# Patient Record
Sex: Male | Born: 1975 | Race: White | Hispanic: No | Marital: Married | Smoking: Former smoker
Health system: Southern US, Community
[De-identification: ages and names within clinical notes are randomized; demographics above are authoritative.]

---

## 2003-02-06 ENCOUNTER — Emergency Department (HOSPITAL_COMMUNITY): Admission: EM | Admit: 2003-02-06 | Discharge: 2003-02-06 | Payer: Self-pay | Admitting: Emergency Medicine

## 2005-04-26 DIAGNOSIS — G610 Guillain-Barre syndrome: Secondary | ICD-10-CM

## 2005-04-26 HISTORY — DX: Guillain-Barre syndrome: G61.0

## 2005-05-25 ENCOUNTER — Emergency Department (HOSPITAL_COMMUNITY): Admission: EM | Admit: 2005-05-25 | Discharge: 2005-05-25 | Payer: Self-pay | Admitting: Family Medicine

## 2005-05-29 ENCOUNTER — Emergency Department (HOSPITAL_COMMUNITY): Admission: EM | Admit: 2005-05-29 | Discharge: 2005-05-29 | Payer: Self-pay | Admitting: Emergency Medicine

## 2005-05-31 ENCOUNTER — Ambulatory Visit: Payer: Self-pay | Admitting: Physical Medicine & Rehabilitation

## 2005-05-31 ENCOUNTER — Inpatient Hospital Stay (HOSPITAL_COMMUNITY): Admission: EM | Admit: 2005-05-31 | Discharge: 2005-06-07 | Payer: Self-pay | Admitting: Emergency Medicine

## 2005-07-09 ENCOUNTER — Encounter: Admission: RE | Admit: 2005-07-09 | Discharge: 2005-07-09 | Payer: Self-pay | Admitting: Family Medicine

## 2007-06-25 IMAGING — CR DG CHEST 2V
2 series · 2 of 2 positions shown · non-contrast
Comparison: none

CLINICAL DATA: [place]?.  Evaluate for pneumonia. 
 CHEST ? 2 VIEW:

[w chest pa]
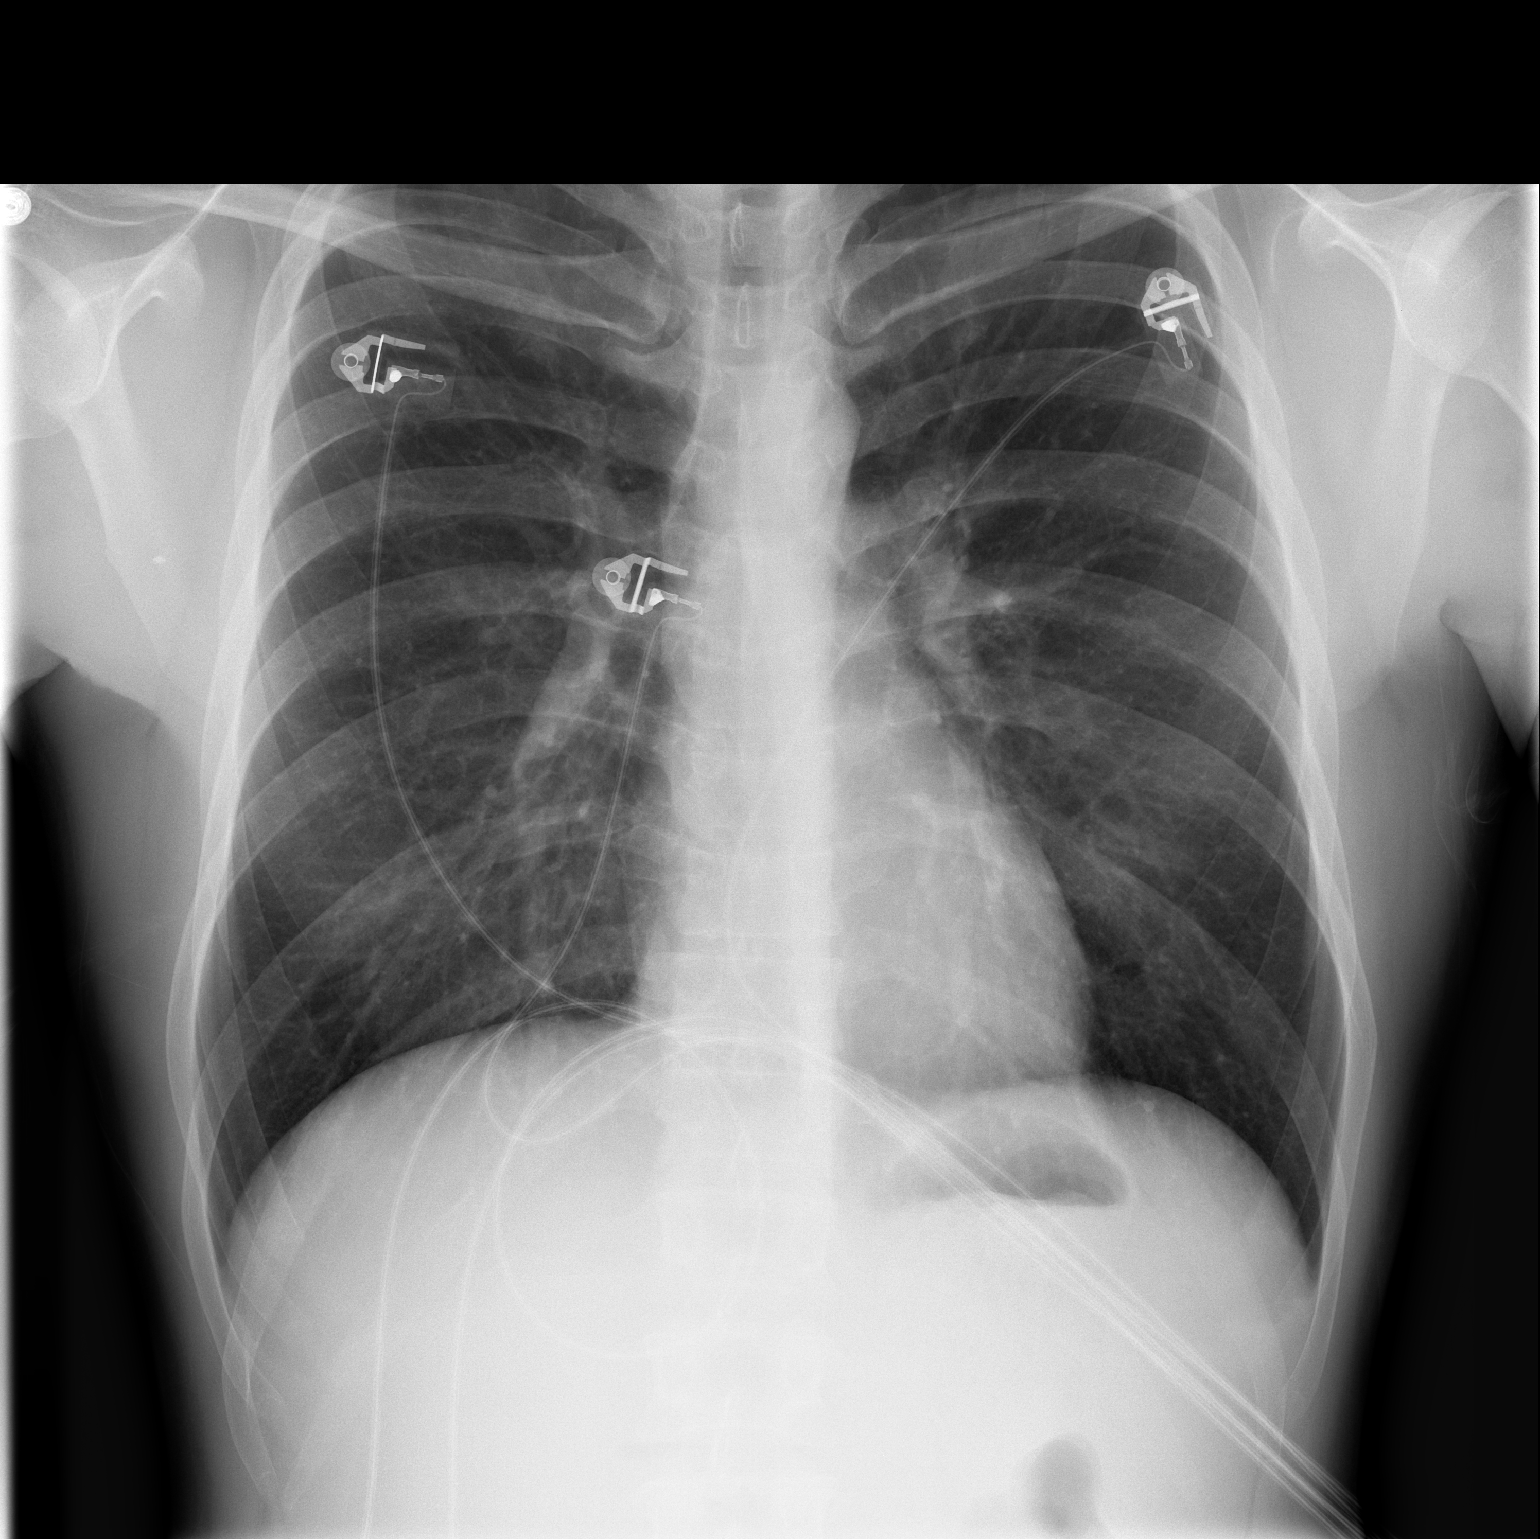

[w chest lat]
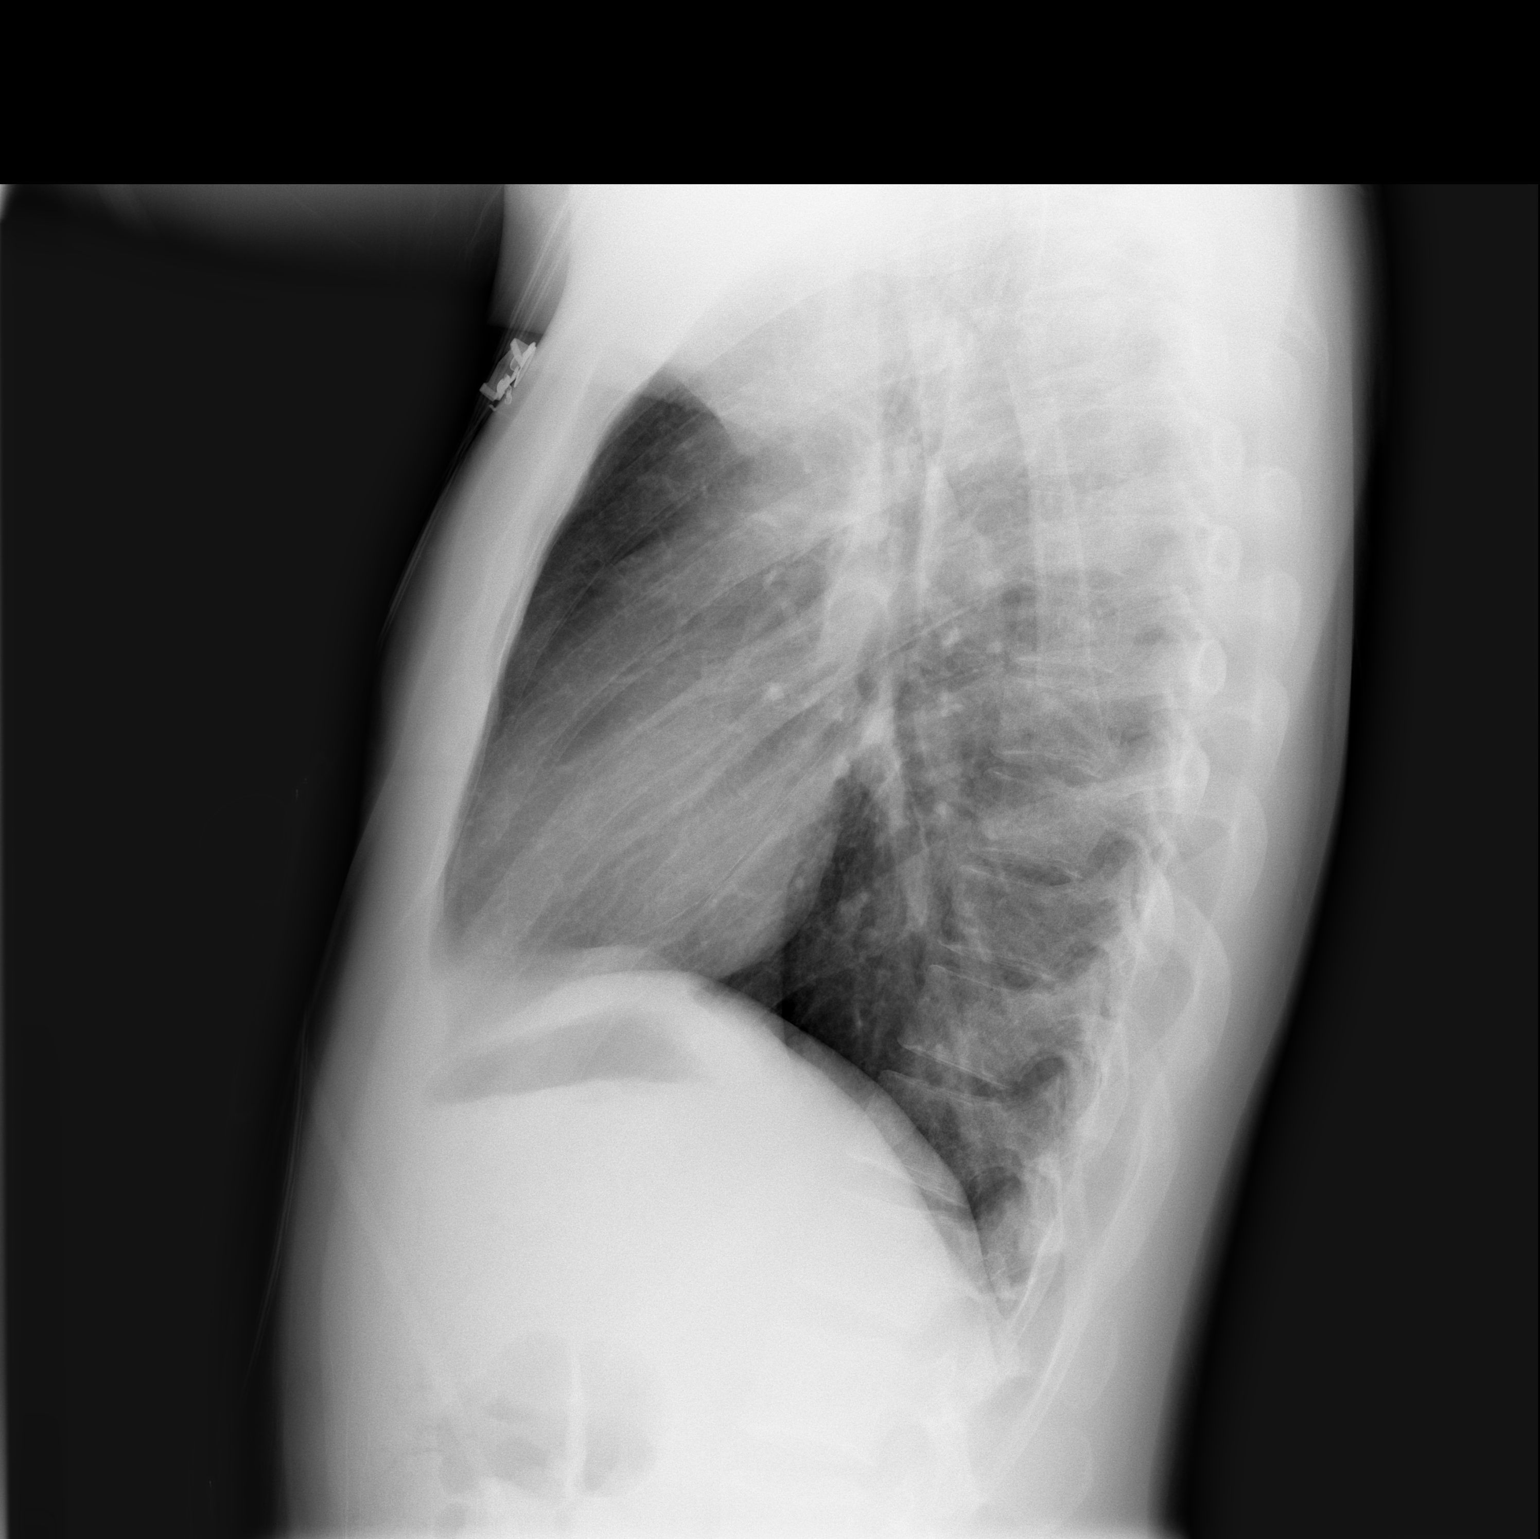

[2 of 2 positions shown; findings below may reference images not displayed]

FINDINGS: Two views of the chest show the lungs to be clear.  The heart is within normal limits in size.  No bony abnormality is seen.
IMPRESSION: No active lung disease.

## 2018-10-18 ENCOUNTER — Encounter (HOSPITAL_BASED_OUTPATIENT_CLINIC_OR_DEPARTMENT_OTHER): Payer: Self-pay | Admitting: Emergency Medicine

## 2018-10-18 ENCOUNTER — Other Ambulatory Visit: Payer: Self-pay

## 2018-10-18 ENCOUNTER — Emergency Department (HOSPITAL_BASED_OUTPATIENT_CLINIC_OR_DEPARTMENT_OTHER)
Admission: EM | Admit: 2018-10-18 | Discharge: 2018-10-19 | Disposition: A | Discharge status: Home / Self Care | Attending: Emergency Medicine | Admitting: Emergency Medicine

## 2018-10-18 ENCOUNTER — Emergency Department (HOSPITAL_BASED_OUTPATIENT_CLINIC_OR_DEPARTMENT_OTHER)

## 2018-10-18 DIAGNOSIS — Y990 Civilian activity done for income or pay: Secondary | ICD-10-CM | POA: Diagnosis not present

## 2018-10-18 DIAGNOSIS — Z23 Encounter for immunization: Secondary | ICD-10-CM | POA: Diagnosis not present

## 2018-10-18 DIAGNOSIS — S61111A Laceration without foreign body of right thumb with damage to nail, initial encounter: Secondary | ICD-10-CM | POA: Insufficient documentation

## 2018-10-18 DIAGNOSIS — Y9389 Activity, other specified: Secondary | ICD-10-CM | POA: Insufficient documentation

## 2018-10-18 DIAGNOSIS — W230XXA Caught, crushed, jammed, or pinched between moving objects, initial encounter: Secondary | ICD-10-CM | POA: Diagnosis not present

## 2018-10-18 DIAGNOSIS — Y9289 Other specified places as the place of occurrence of the external cause: Secondary | ICD-10-CM | POA: Diagnosis not present

## 2018-10-18 DIAGNOSIS — Z87891 Personal history of nicotine dependence: Secondary | ICD-10-CM | POA: Diagnosis not present

## 2018-10-18 DIAGNOSIS — S60111A Contusion of right thumb with damage to nail, initial encounter: Secondary | ICD-10-CM

## 2018-10-18 DIAGNOSIS — S6991XA Unspecified injury of right wrist, hand and finger(s), initial encounter: Secondary | ICD-10-CM | POA: Diagnosis present

## 2018-10-18 DIAGNOSIS — S6701XA Crushing injury of right thumb, initial encounter: Secondary | ICD-10-CM

## 2018-10-18 MED ORDER — FENTANYL CITRATE (PF) 100 MCG/2ML IJ SOLN
100.0000 ug | Freq: Once | INTRAMUSCULAR | Status: AC
  Administered 2018-10-19: 100 ug via INTRAVENOUS
  Filled 2018-10-18: qty 2

## 2018-10-18 MED ORDER — CEFAZOLIN SODIUM-DEXTROSE 1-4 GM/50ML-% IV SOLN
1.0000 g | Freq: Once | INTRAVENOUS | Status: AC
Start: 2018-10-19 — End: 2018-10-19
  Administered 2018-10-19: 1 g via INTRAVENOUS
  Filled 2018-10-18: qty 50

## 2018-10-18 MED ORDER — TETANUS-DIPHTH-ACELL PERTUSSIS 5-2.5-18.5 LF-MCG/0.5 IM SUSP
0.5000 mL | Freq: Once | INTRAMUSCULAR | Status: AC
  Administered 2018-10-19: 0.5 mL via INTRAMUSCULAR
  Filled 2018-10-18: qty 0.5

## 2018-10-18 NOTE — ED Provider Notes (Signed)
[facility] Provider Note: Georgena Spurling, MD, FACEP  CSN: [id number] MRN: [id number] ARRIVAL: 10/18/18 at 80 ROOM: Reader  Thumb Injury   HISTORY OF PRESENT ILLNESS  10/18/18 11:41 PM Andrew Anderson is a 43 y.o. male who suffered a crush injury of his right thumb at work just prior to arrival.  He has a laceration across the pad of his right thumb.  He has associated pain which he rates as an 8 out of 10, worse with movement or palpation.  The pain is primarily in the distal phalanx but radiates proximally.  There is no functional deficit.  Sensation is present but decreased distally.  Tetanus is not up-to-date.   Past Medical History:  Diagnosis Date  . Guillain Barr syndrome ([place]) 04/26/2005    History reviewed. No pertinent surgical history.  No family history on file.  Social History   Tobacco Use  . Smoking status: Former Smoker  Substance Use Topics  . Alcohol use: Not on file    Comment: occ  . Drug use: Never    Prior to Admission medications   Not on File    Allergies Patient has no known allergies.   REVIEW OF SYSTEMS  Negative except as noted here or in the History of Present Illness.   PHYSICAL EXAMINATION  Initial Vital Signs Blood pressure (!) 150/105, pulse 90, temperature 99.2 F (37.3 C), resp. rate 18, height 5\' 11"  (1.803 m), weight 79.4 kg, SpO2 100 %.  Examination General: Well-developed, well-nourished male in no acute distress; appearance consistent with age of record HENT: normocephalic; atraumatic Eyes: Normal appearance Neck: supple Heart: regular rate and rhythm Lungs: clear to auscultation bilaterally Abdomen: soft; nondistended; nontender; bowel sounds present Extremities: No deformity; full range of motion; pulses normal; laceration and tenderness of distal phalanx of right thumb, subungual hematoma of right thumb, sensation partially decreased distally, brisk capillary refill  distally:    Neurologic: Awake, alert and oriented; motor function intact in all extremities and symmetric; no facial droop Skin: Warm and dry Psychiatric: Normal mood and affect   RESULTS  Summary of this visit's results, reviewed by myself:   EKG Interpretation  Date/Time:    Ventricular Rate:    PR Interval:    QRS Duration:   QT Interval:    QTC Calculation:   R Axis:     Text Interpretation:        Laboratory Studies: No results found for this or any previous visit (from the past 24 hour(s)). Imaging Studies: Dg Finger Thumb Right  Result Date: 10/19/2018 CLINICAL DATA:  43 year old male with trauma to the thumb. EXAM: RIGHT THUMB 2+V COMPARISON:  None. FINDINGS: There is no acute fracture or dislocation. The bones are well mineralized. No arthritic changes. There is laceration of the soft tissues of the distal thumb. No radiopaque foreign object or soft tissue gas. IMPRESSION: No acute/traumatic osseous pathology. Electronically Signed   By: Anner Crete M.D.   On: 10/19/2018 00:30    ED COURSE and MDM  Nursing notes and initial vitals signs, including pulse oximetry, reviewed.  Vitals:   10/18/18 2335 10/18/18 2336  BP: (!) 150/105   Pulse: 90   Resp: 18   Temp: 99.2 F (37.3 C)   SpO2: 100%   Weight:  79.4 kg  Height:  5\' 11"  (1.803 m)   On exploration there was no deep laceration to repair.  Patient advised of signs and symptoms of compartment syndrome and  need to return should these occur.  He was advised he may use the thumb as tolerated providing he keeps it dry and bandaged.  PROCEDURES   NAIL TREPHINATION After informed verbal consent was obtained the patient's right thumb nail was trephinated with a pen cautery.  There was immediate release of blood from under the thumbnail.  The patient tolerated this well and there were no immediate complications.  LACERATION REPAIR Performed by: Carlisle BeersJohn L Dalin Caldera Authorized by: Carlisle BeersJohn L Idabell Picking Consent: Verbal  consent obtained. Risks and benefits: risks, benefits and alternatives were discussed Consent given by: patient Patient identity confirmed: provided demographic data Prepped and Draped in normal sterile fashion Wound explored, no deep laceration, wound consists of denuded epidermis only  Laceration Location: Right thumb pad  Laceration Length: 2.5 cm  No Foreign Bodies seen or palpated  Anesthesia: Digital block  Local anesthetic: Bupivacaine 0.5 % without epinephrine  Anesthetic total: 5 ml  Irrigation method: syringe Amount of cleaning: Extensive  Skin closure: Not indicated  Patient tolerance: Patient tolerated the procedure well with no immediate complications.   ED DIAGNOSES     ICD-10-CM   1. Crushing injury of right thumb, initial encounter  S67.01XA   2. Subungual hematoma of right thumb, initial encounter  Z61.096ES60.111A        Paula LibraMolpus, Adelaida Reindel, MD 10/19/18 [phone]
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
## 2018-10-19 MED ORDER — CEPHALEXIN 500 MG PO CAPS: 500.0000 mg | ORAL_CAPSULE | Freq: Four times a day (QID) | ORAL | 0 refills | Status: DC

## 2018-10-19 NOTE — ED Notes (Signed)
Provider at bedside for laceration/crush repair.

## 2018-10-19 NOTE — ED Notes (Signed)
Curad applied. Sterile guaze applied. Wound wrapped and secured. Patient informed on wound care.

## 2018-10-19 NOTE — ED Notes (Signed)
Provider at bedside

## 2019-03-15 ENCOUNTER — Telehealth: Payer: Self-pay | Admitting: Physician Assistant

## 2019-03-15 DIAGNOSIS — B86 Scabies: Secondary | ICD-10-CM

## 2019-03-15 MED ORDER — PERMETHRIN 5 % EX CREA: TOPICAL_CREAM | CUTANEOUS | 0 refills | Status: AC

## 2019-03-15 NOTE — Progress Notes (Signed)
E-Visit for Scabies  We are sorry that you are not feeling well. Here is how we plan to help!  Based on what you shared with me it looks like you have scabies.  Scabies is an infection caused by very tiny mites that burrow into the skin.  They cause severe itching. Though children are most commonly infected, anyone can get scabies.  Scabies mites can pass from person to person through physical close contact.  They can also be passed through shared clothing, towels, and bedding.  Scabies infection is not usually dangerous, but it is uncomfortable.  Because it is so contagious, scabies should be treated immediately to keep the infection from spreading.  If you have children in day care, please have any exposed children examined by their pediatrician. .   I have prescribed Permethrin topical cream 5% thoroughly massage cream from head to soles of feet; leave on for 8 to 14 hours before removing (shower or bath). May repeat if living mites are observed 14 days after first treatment; one application is generally curative.    HOME CARE:  . You should treat all members in your household whether they show symptoms or not. . Wash towels, clothes, bed linens, cloth toys, hats, and other personal items in hot water and dry on high heat. . Vacuum floors and furniture and throw away the bag afterwards. . Keep your child home from day care or school until the morning after treatment for scabies. . Notify your child's day care or school so that other children can be checked and treated.  GET HELP RIGHT AWAY IF:  . The infected person has a fever, red streaks, pain, or swelling of the skin. . Sores get worse or don't heal. . New rashes appear or itching continues for more than 2 weeks after treatment.  MAKE SURE YOU:   Understand these instructions.  Will watch your condition.  Will get help right away if you are not doing well or get worse.  Thank you for choosing an e-visit. Your e-visit answers  were reviewed by a board certified advanced clinical practitioner to complete your personal care plan.  Depending upon the condition, your plan could have included both over the counter or prescription medications.    Please review your pharmacy choice.  Make sure the pharmacy is open so you can pick up prescription now.   If there is a problem, you may contact your provider through MyChart messaging and have the prescription routed to another pharmacy. Your safety is important to us.  If you have drug allergies check your prescription carefully.    For the next 24 hours you can use MyChart to ask questions about today's visit, request a non-urgent call back, or ask for a work or school excuse.  You will get an email in the next 2 days asking about your experience.  I hope that your e-visit has been valuable and will speed your recovery.   Greater than 5 minutes, yet less than 10 minutes of time have been spent researching, coordinating, and implementing care for this patient today  

## 2019-08-03 ENCOUNTER — Telehealth: Payer: Self-pay | Admitting: Nurse Practitioner

## 2019-08-03 DIAGNOSIS — N61 Mastitis without abscess: Secondary | ICD-10-CM

## 2019-08-03 MED ORDER — CEPHALEXIN 500 MG PO CAPS: 500.0000 mg | ORAL_CAPSULE | Freq: Four times a day (QID) | ORAL | 0 refills | Status: AC

## 2019-08-03 NOTE — Progress Notes (Signed)

## 2019-09-25 ENCOUNTER — Other Ambulatory Visit: Payer: Self-pay

## 2019-09-25 ENCOUNTER — Emergency Department (HOSPITAL_BASED_OUTPATIENT_CLINIC_OR_DEPARTMENT_OTHER)
Admission: EM | Admit: 2019-09-25 | Discharge: 2019-09-25 | Disposition: A | Discharge status: Home / Self Care | Attending: Emergency Medicine | Admitting: Emergency Medicine

## 2019-09-25 ENCOUNTER — Encounter (HOSPITAL_BASED_OUTPATIENT_CLINIC_OR_DEPARTMENT_OTHER): Payer: Self-pay | Admitting: *Deleted

## 2019-09-25 DIAGNOSIS — Y990 Civilian activity done for income or pay: Secondary | ICD-10-CM | POA: Diagnosis not present

## 2019-09-25 DIAGNOSIS — T542X1A Toxic effect of corrosive acids and acid-like substances, accidental (unintentional), initial encounter: Secondary | ICD-10-CM | POA: Insufficient documentation

## 2019-09-25 DIAGNOSIS — X58XXXA Exposure to other specified factors, initial encounter: Secondary | ICD-10-CM | POA: Diagnosis not present

## 2019-09-25 DIAGNOSIS — T304 Corrosion of unspecified body region, unspecified degree: Secondary | ICD-10-CM

## 2019-09-25 DIAGNOSIS — Y9389 Activity, other specified: Secondary | ICD-10-CM | POA: Insufficient documentation

## 2019-09-25 DIAGNOSIS — Z87891 Personal history of nicotine dependence: Secondary | ICD-10-CM | POA: Diagnosis not present

## 2019-09-25 DIAGNOSIS — T2059XA Corrosion of first degree of multiple sites of head, face, and neck, initial encounter: Secondary | ICD-10-CM | POA: Insufficient documentation

## 2019-09-25 DIAGNOSIS — Y9289 Other specified places as the place of occurrence of the external cause: Secondary | ICD-10-CM | POA: Diagnosis not present
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
## 2019-11-06 ENCOUNTER — Telehealth: Payer: Managed Care, Other (non HMO) | Admitting: Nurse Practitioner

## 2019-11-06 DIAGNOSIS — K640 First degree hemorrhoids: Secondary | ICD-10-CM | POA: Diagnosis not present

## 2019-11-06 MED ORDER — HYDROCORTISONE ACETATE 25 MG RE SUPP: 25.0000 mg | Freq: Two times a day (BID) | RECTAL | 0 refills | Status: DC

## 2019-11-06 NOTE — Progress Notes (Signed)
E-Visit for Hemorrhoid  We are sorry that you are not feeling well. We are here to help!  Hemorrhoids are swollen veins in the rectum. They can cause itching, bleeding, and pain. Hemorrhoids are very common.  In some cases, you can see or feel hemorrhoids around the outside of the rectum. In other cases, you cannot see them because they are hidden inside the rectum. Be patient - It can take months for this to improve or go away.   Hemorrhoids do not always cause symptoms. But when they do, symptoms can include: ?Itching of the skin around the anus ?Bleeding - Bleeding is usually painless. You might see bright red blood after using the toilet. ?Pain - If a blood clot forms inside a hemorrhoid, this can cause pain. It can also cause a lump that you might be able to feel.   What can I do to keep from getting more hemorrhoids? -- The most important thing you can do is to keep from getting constipated. You should have a bowel movement at least a few times a week. When you have a bowel movement, you also should not have to push too much. Plus, your bowel movements should not be too hard. Being constipated and having hard bowel movements can make hemorrhoids worse.   I have prescribed Anusol HC suppositories.  Insert into rectum twice per day for 6 days  HOME CARE: Sitz Baths twice daily. Soak buttocks in 2 or 3 inches of warm water for 10 to 15 minutes. Do not add soap, bubble bath, or anything to the water. Stool softener such as Colace 100 mg twice daily AND Miralax 1 scoop daily until you have regular soft stools Over the counter Preparation H Tucks Pads Witch Hazel  Here are some steps you can take to avoid getting constipated or having hard stools:  ?Eat lots of fruits, vegetables, and other foods with fiber. Fiber helps to increase bowel movements. If you do not get enough fiber from your diet, you can take fiber supplements. These come in the form of powders, wafers, or pills. Some  examples are Metamucil, Citrucel, Benefiber and FiberCon. If you take a fiber supplement, be sure to read the label so you know how much to take. If you're not sure, ask your provider or nurse. ?Take medicines called "stool softeners" such as docusate sodium (sample brand names: Colace, Dulcolax). These medicines increase the number of bowel movements you have. They are safe to take and they can prevent problems later.  You should request a referral for a follow up evaluation with a Gastroenterologist (GI doctor) to evaluate this chronic and relapsing condition - even if it improves to see what further steps need to be taken. This is highly linked to chronic constipation and straining to have a bowel movement. It may require further treatment or surgical intervention.   GET HELP RIGHT AWAY IF: You develop severe pain You have heavy bleeding   FOLLOW UP WITH YOUR PRIMARY PROVIDER IF: If your symptoms do not improve within 10 days  MAKE SURE YOU  Understand these instructions. Will watch your condition. Will get help right away if you are not doing well or get worse.  Your e-visit answers were reviewed by a board certified advanced clinical practitioner to complete your personal care plan. Depending upon the condition, your plan could have included both over the counter or prescription medications.  Your safety is important to us. If you have drug allergies check your prescription carefully.     You can use MyChart to ask questions about today's visit, request a non-urgent call back, or ask for a work or school excuse for 24 hours related to this e-Visit. If it has been greater than 24 hours you will need to follow up with your provider, or enter a new e-Visit to address those concerns.  You will get an e-mail with a link to a survey asking about your experience.  We hope that your e-visit has been valuable and will speed your recovery! Thank you for using e-visits.    5-10 minutes spent  reviewing and documenting in chart.   

## 2020-05-17 ENCOUNTER — Telehealth: Payer: Managed Care, Other (non HMO) | Admitting: Orthopedic Surgery

## 2020-05-17 DIAGNOSIS — K649 Unspecified hemorrhoids: Secondary | ICD-10-CM | POA: Diagnosis not present

## 2020-05-17 MED ORDER — HYDROCORTISONE ACETATE 25 MG RE SUPP: 25.0000 mg | Freq: Two times a day (BID) | RECTAL | 0 refills | Status: AC

## 2020-05-17 NOTE — Progress Notes (Signed)
E-Visit for Hemorrhoid  We are sorry that you are not feeling well. We are here to help!  Hemorrhoids are swollen veins in the rectum. They can cause itching, bleeding, and pain. Hemorrhoids are very common.  In some cases, you can see or feel hemorrhoids around the outside of the rectum. In other cases, you cannot see them because they are hidden inside the rectum. Be patient - It can take months for this to improve or go away.   Hemorrhoids do not always cause symptoms. But when they do, symptoms can include: ?Itching of the skin around the anus ?Bleeding - Bleeding is usually painless. You might see bright red blood after using the toilet. ?Pain - If a blood clot forms inside a hemorrhoid, this can cause pain. It can also cause a lump that you might be able to feel.   What can I do to keep from getting more hemorrhoids? -- The most important thing you can do is to keep from getting constipated. You should have a bowel movement at least a few times a week. When you have a bowel movement, you also should not have to push too much. Plus, your bowel movements should not be too hard. Being constipated and having hard bowel movements can make hemorrhoids worse.   I have prescribed Anusol HC suppositories.  Insert into rectum twice per day for 6 days  HOME CARE: . Sitz Baths twice daily. Soak buttocks in 2 or 3 inches of warm water for 10 to 15 minutes. Do not add soap, bubble bath, or anything to the water. . Stool softener such as Colace 100 mg twice daily AND Miralax 1 scoop daily until you have regular soft stools . Over the counter Preparation H . Tucks Pads . Witch Hazel  Here are some steps you can take to avoid getting constipated or having hard stools:  ?Eat lots of fruits, vegetables, and other foods with fiber. Fiber helps to increase bowel movements. If you do not get enough fiber from your diet, you can take fiber supplements. These come in the form of powders, wafers, or pills.  Some examples are Metamucil, Citrucel, Benefiber and FiberCon. If you take a fiber supplement, be sure to read the label so you know how much to take. If you're not sure, ask your provider or nurse. ?Take medicines called "stool softeners" such as docusate sodium (sample brand names: Colace, Dulcolax). These medicines increase the number of bowel movements you have. They are safe to take and they can prevent problems later.  You should request a referral for a follow up evaluation with a Gastroenterologist (GI doctor) to evaluate this chronic and relapsing condition - even if it improves to see what further steps need to be taken. This is highly linked to chronic constipation and straining to have a bowel movement. It may require further treatment or surgical intervention.   GET HELP RIGHT AWAY IF: . You develop severe pain . You have heavy bleeding   FOLLOW UP WITH YOUR PRIMARY PROVIDER IF: . If your symptoms do not improve within 10 days  MAKE SURE YOU   Understand these instructions.  Will watch your condition.  Will get help right away if you are not doing well or get worse.  Your e-visit answers were reviewed by a board certified advanced clinical practitioner to complete your personal care plan. Depending upon the condition, your plan could have included both over the counter or prescription medications.  Your safety is important to   us. If you have drug allergies check your prescription carefully.   You can use MyChart to ask questions about today's visit, request a non-urgent call back, or ask for a work or school excuse for 24 hours related to this e-Visit. If it has been greater than 24 hours you will need to follow up with your provider, or enter a new e-Visit to address those concerns.  You will get an e-mail with a link to a survey asking about your experience.  We hope that your e-visit has been valuable and will speed your recovery! Thank you for using  e-visits.      Greater than 5 minutes, yet less than 10 minutes of time have been spent researching, coordinating and implementing care for this patient today.   

## 2020-06-28 ENCOUNTER — Telehealth: Payer: Managed Care, Other (non HMO) | Admitting: Emergency Medicine

## 2020-06-28 DIAGNOSIS — K625 Hemorrhage of anus and rectum: Secondary | ICD-10-CM

## 2020-06-28 NOTE — Progress Notes (Signed)
Hi Andrew Anderson,  This is your 3rd Evisit since July 2021 for similar symptoms. Because you do not have an external hemorrhoid and no prior colonoscopy, there is concern your symptoms are related to something more significant than a simple hemorrhoid.  Based on what you shared with me, I feel your condition warrants further evaluation and I recommend that you be seen for a face to face office visit.  I apologize for any inconvenience, but I want to ensure you are receiving the most appropriate medical care.    NOTE: If you entered your credit card information for this eVisit, you will not be charged. You may see a "hold" on your card for the $35 but that hold will drop off and you will not have a charge processed.   If you are having a true medical emergency please call 911.      For an urgent face to face visit, [facility] has five urgent care centers for your convenience:     [facility] at [facility] Directions [phone] [address] [place], [place] [address] . 10 am - 6pm Monday - Friday    [facility]) Get Driving Directions [phone] [address] [place], [place] [address] . 10 am to 8 pm Monday-Friday . 12 pm to 8 pm [facility] Urgent Care at [facility] Get Driving Directions [phone] [address] [place], [place] [address] . 8 am to 8 pm Monday-Friday . 9 am to 6 pm Saturday . 11 am to 6 pm Sunday     [facility] at [facility] Get Driving Directions  [phone] [address] [place], [place] [address] . 8 am to 8 pm Monday-Friday . 8 am to 4 pm [facility] Urgent Care at [facility] Directions [phone] [address] [place], [place] [address] . 12 pm to 6 pm Monday-Friday      Your e-visit answers were reviewed by a board certified advanced clinical  practitioner to complete your personal care plan.  Thank you for using e-Visits.    Greater than 5 minutes, yet less than 10 minutes of time have been spent researching, coordinating, and implementing care for this patient today.

## 2020-11-10 IMAGING — DX RIGHT THUMB 2+V
3 series · 3 of 3 positions shown · non-contrast
Comparison: None.

CLINICAL DATA: 42-year-old male with trauma to the thumb.

EXAM:
RIGHT THUMB 2+V

[finger ap]
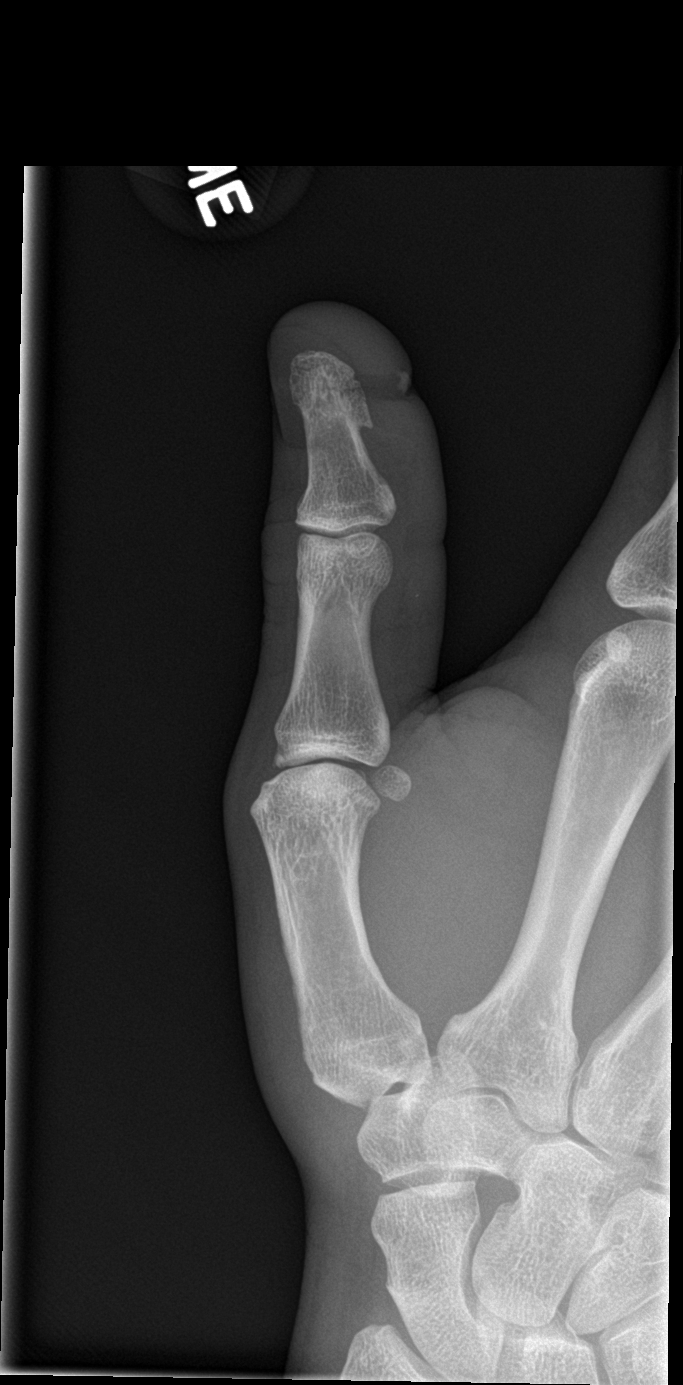

[finger obl]
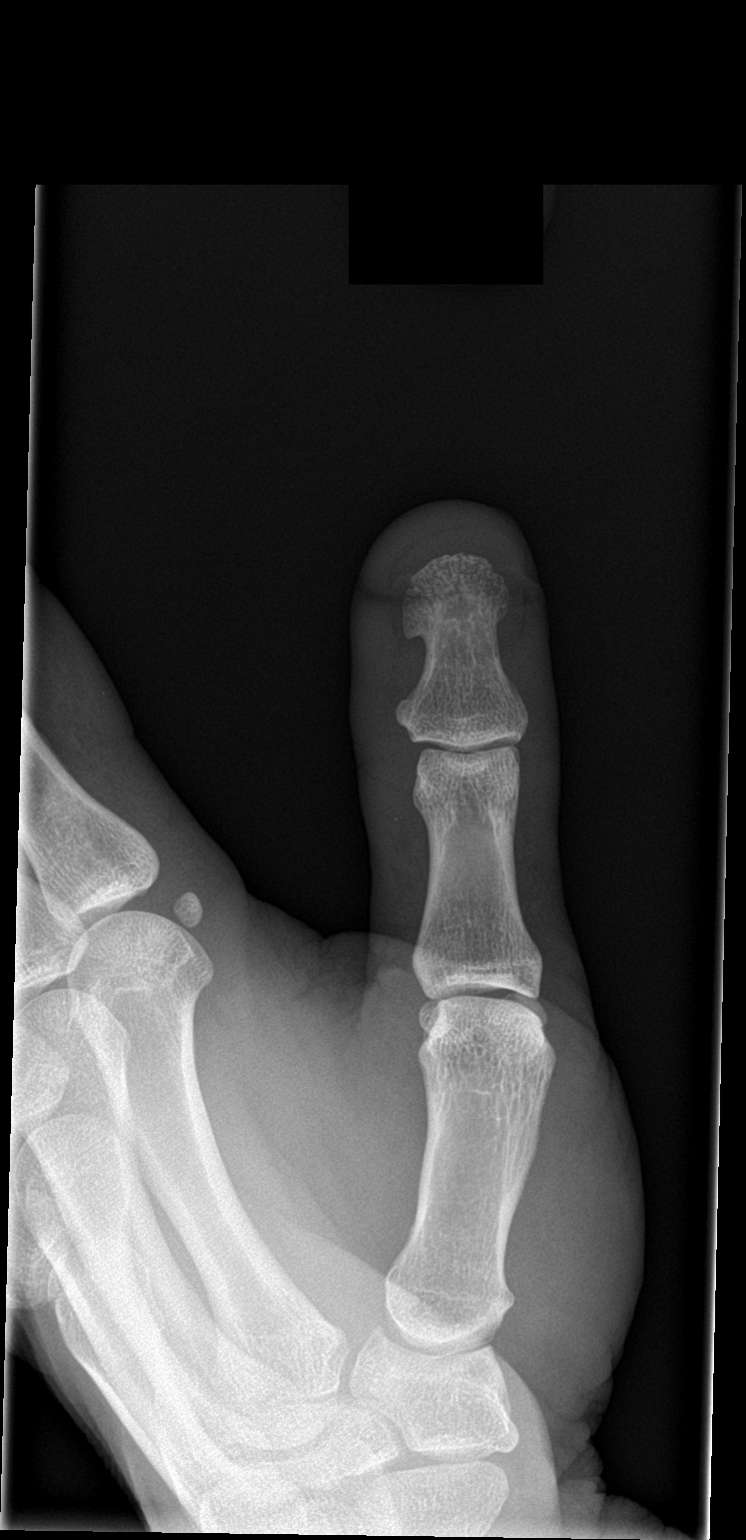

[finger lat]
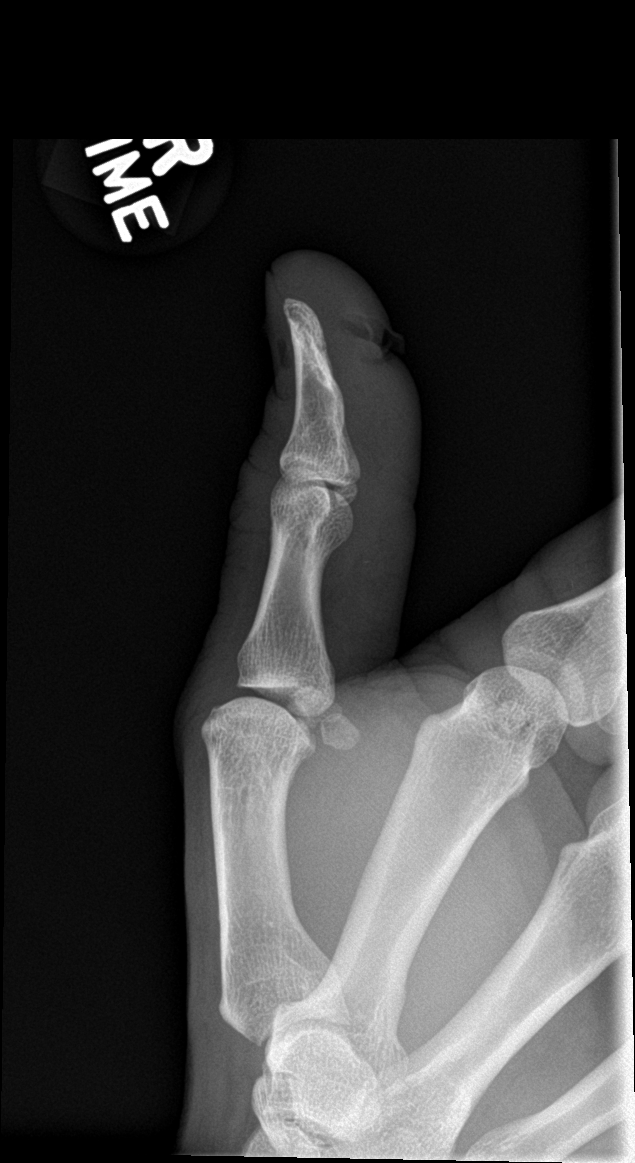

[3 of 3 positions shown; findings below may reference images not displayed]

FINDINGS: There is no acute fracture or dislocation. The bones are well
mineralized. No arthritic changes. There is laceration of the soft
tissues of the distal thumb. No radiopaque foreign object or soft
tissue gas.
IMPRESSION: No acute/traumatic osseous pathology.

## 2021-09-09 ENCOUNTER — Ambulatory Visit: Payer: Self-pay | Admitting: Surgery
